# Patient Record
Sex: Female | Born: 2005
Health system: Southern US, Community
[De-identification: ages and names within clinical notes are randomized; demographics above are authoritative.]

---
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
## 2024-01-19 DIAGNOSIS — Z30430 Encounter for insertion of intrauterine contraceptive device: Secondary | ICD-10-CM | POA: Diagnosis not present

## 2024-03-29 DIAGNOSIS — N906 Unspecified hypertrophy of vulva: Secondary | ICD-10-CM | POA: Diagnosis not present

## 2024-06-11 ENCOUNTER — Ambulatory Visit: Admit: 2024-06-11 | Admitting: Obstetrics and Gynecology

## 2024-06-11 SURGERY — LABIAPLASTY, VULVA
Anesthesia: General

## 2024-06-12 ENCOUNTER — Other Ambulatory Visit (HOSPITAL_COMMUNITY): Payer: Self-pay

## 2024-06-12 MED ORDER — TRETINOIN 0.05 % EX CREA
TOPICAL_CREAM | Freq: Every evening | CUTANEOUS | 6 refills | Status: AC
  Filled 2024-06-12: qty 45, 90d supply, fill #0
